# Patient Record
Sex: Female | Born: 1995 | Race: White | Hispanic: No | Marital: Single
Health system: Midwestern US, Academic
[De-identification: ages and names within clinical notes are randomized; demographics above are authoritative.]

---

## 2017-01-17 ENCOUNTER — Encounter: Admit: 2017-01-17 | Discharge: 2017-01-17 | Payer: BC Managed Care – PPO

## 2017-01-17 ENCOUNTER — Ambulatory Visit: Admit: 2017-01-17 | Discharge: 2017-01-18 | Payer: BC Managed Care – PPO

## 2017-01-17 NOTE — Progress Notes
Date of Service: 01/17/2017     Subjective:   DOI: Approximately 2016            History of Present Illness    Amanda Edwards is a 21 y.o. female here accompanied by her mother Amanda Edwards) at the recommendation of Amanda Edwards, DC for evaluation of chronic lumbosacral back pain which started without trauma at least 2 years ago.  She has an Buyer, retail at [employer] who is a Careers adviser for [employer]	[place] team and her lumbosacral back pain is interfering with her ability to play.  She recalls doing some weight training and with heavy squats and hanging clean she had pain in her lumbosacral back which would radiate to the posterior lateral right thigh and occasionally to her right calf.  She denies any foot pain paresthesias or dysesthesias.  Denies bowel or bladder dysfunction.  She did not work with her Producer, television/film/video as she states she frequently would cover up the pain in order to play.  Then last fall with some preseason conditioning she had increasing pain and would notice pain when she would bend forward to field the ball.  She has not had trouble batting is a right-handed batter.  She occasionally will feel pain as she throws from center field and follow through leaning forward.  Prolonged sitting does increase back pain.  She has no trouble sleeping.  She is not taking any medication for pain but did have some chiropractic manipulation which did not afford much relief.  Also describes some light therapy with her chiropractor which she thinks may be giving some relief.  She saw her primary care physician, Dr. Jiles Edwards, in [place] and had an MRI on 12/31/16.  She has not had plain radiographs.  The MRI did show a large central disc protrusion at L4-L5 and a smaller central disc protrusion at L5-S1 with comments from radiologist interpreting that she has a reduction in the AP diameter of the midline thecal sac.  She has tried meloxicam but no relief. PMH negative for previous back trauma or problems.  She has no chronic medical disorders  Medications: Currently none as she discontinued the meloxicam  Allergies: NKDA  Social history she is a Holiday representative at [employer].  She comes from an intact family and is working part-time at [employer] as a Conservation officer, nature.  Incidentally she states she is not having trouble working nor did she have any work-related injuries to her back.  Family history noncontributory for presenting complaint  13 point review of systems otherwise noncontributory except for that noted in the HPI above.  She gets the Depo-Provera injection so menstruates every 3 months         Review of Systems      Objective:         ??? NO HOME MEDICATIONS      Vitals:    01/17/17 1515   BP: 119/65   Pulse: 71   Resp: 16   SpO2: 99%   Weight: 63.5 kg (140 lb)   Height: 165.1 cm (65)     Body mass index is 23.3 kg/m???.     Physical Exam   Reviewed nursing intake, vital signs stable  General: Athletic appearing young female, NAD, articulate speech  Psych: affect and mood appropriate, cooperative during encounter  Neck: supple without lymphadenopathy  Chest/lungs: non-labored respirations  Skin:  non-diaphoretic    Ortho Exam  General-no acute distress ambulates without antalgic gait.  She has  good posture with no pelvic tilt and normal lumbar lordosis and thoracic kyphosis.  Back-full range of motion with increased lumbosacral pain on sustained back flexion but she can touch the dorsum of her feet.  She has no increased pain with back extension including single leg back extension does have some mild pain as she right laterally side bends but no pain on back rotation.  No tenderness or muscle spasm on palpation and no pain with percussion of her lumbar spine and paraspinal musculature to the SI joints bilaterally.  Straight leg raise seated is negative but supine does increase pain lumbosacral back and right buttocks and posterior thigh at 45???.  No lower extremity strength deficits on knee extension, flexion, ankle DF/PF/eversion, great toe extension/flexion.  DTRs 2+/4 patellar and Achilles bilaterally symmetrical.  Screening hip exam unremarkable.  X-rays-I chose to defer plain radiographs given review of the recent MRI dated 12/31/16 which shows L4-5 has a moderate-sized broad-based central disc protrusion reducing the midline AP diameter of the thecal sac to 0.8 cm.  L5-S1 has a small central disc protrusion without compression of the thecal sac I was unable to review images but these were requested from diagnostic imaging, Amanda Edwards location       Assessment and Plan:  1. Protrusion of lumbar intervertebral disc L4-L5  AMB REFERRAL TO PHYSICAL OR OCCUPATIONAL THERAPY    AMB REFERRAL TO SPINE CENTER   2. Right lumbosacral radiculopathy  AMB REFERRAL TO PHYSICAL OR OCCUPATIONAL THERAPY    AMB REFERRAL TO SPINE CENTER     Explained to patient and her mother that with chronicity of pain and clinical findings consistent with MRI findings in the absence of strength deficits and reflex deficits she would be a good candidate for epidural corticosteroid injections followed by specific spine physical therapy and can be coordinated also with her athletic training staff for her off-season conditioning.  However she will need to modify her activity significantly during this period of treatment and physical therapy can slowly get her back through a functional progression.  I would like to see her back one month after she initiates this treatment plan and patient and her mother expressed full understanding.  She will sign consent to allow [place] to communicate with her athletic training staff at [facility].    Your diagnosis today is:  1) L4-L5 central disc protrusion with smaller disc protrusion at L5-S1; 2) Right LS radiculopathy    Recommendations as follows:    1.  Activity modification:  No running, jumping, fielding, or hitting until epidural injection done and PT starts but can cross train with swimming, elliptical, and back neutral weight training (as directed by PT)  2.  Medication:  None but ok for Tylenol for pain  3.  Therapy:  PT Pernell Dupre PT or [facility]  4.  Intervention:  Referral to pain specialist for epidural cortisone injection  5.  Follow up:  One month after starting above treatment plan.  We will communicate this to your ATC at [facility]        Please call my Clinical Athletic Trainer Aundra Millet) at [phone] if clinical questions / concerns.      For follow-up appointments call [phone].    The above note was completed using a dragon voice recognition system so please excuse any typos/misspellings/errors.  If you need clarification please contact my office.

## 2017-01-18 ENCOUNTER — Encounter: Admit: 2017-01-18 | Discharge: 2017-01-18 | Payer: BC Managed Care – PPO

## 2017-01-18 DIAGNOSIS — R69 Illness, unspecified: Principal | ICD-10-CM

## 2017-01-18 DIAGNOSIS — M5417 Radiculopathy, lumbosacral region: ICD-10-CM

## 2017-01-18 DIAGNOSIS — M5126 Other intervertebral disc displacement, lumbar region: Principal | ICD-10-CM

## 2017-02-12 ENCOUNTER — Encounter: Admit: 2017-02-12 | Discharge: 2017-02-12 | Payer: BC Managed Care – PPO

## 2017-02-12 ENCOUNTER — Ambulatory Visit: Admit: 2017-02-12 | Discharge: 2017-02-13 | Payer: BC Managed Care – PPO

## 2017-02-12 DIAGNOSIS — M47819 Spondylosis without myelopathy or radiculopathy, site unspecified: ICD-10-CM

## 2017-02-12 DIAGNOSIS — M5126 Other intervertebral disc displacement, lumbar region: Secondary | ICD-10-CM

## 2017-02-12 MED ORDER — TIZANIDINE 2 MG PO TAB
4 mg | ORAL_TABLET | Freq: Every evening | ORAL | 3 refills | Status: AC | PRN
Start: 2017-02-12 — End: ?

## 2017-02-12 NOTE — Progress Notes
Dear Dr Jiles Prows,    I appreciate your kind referral of Amanda Edwards for evaluation of pain. Please see my note below for the full details of the evaluation and management plan.    Thank you,    Evelina Bucy, MD        [facility]  NEW PATIENT HISTORY AND PHYSICAL  Subjective     Chief Complaint: LBP    HPI: Amanda Edwards is a 21 y.o. female who  has no past medical history on file. who now presents for initial consultation.    The pain is in the low back in a band across.  There is radiating pain down the right leg in the anterior and posterolateral distribution.   She also notes pain radiating into the bilateral groin.     Pain started: 2016    Initial inciting injury or event: Possibly due to weight lifting in high school    Numbness/tingling: Yes    The pain ranges 3-10/10    The pain is described as aching, stabbing, shooting, burning, throbbing, sharp.     The pain is exacerbated by sitting and standing.     The pain is partially alleviated by heat, TENS unit.     +muscle stiffness/tightness  Extension of the back is painful    Patient denies achiness/heaviness of legs with walking a long distance that improves with rest. Negative Shopping Cart sign.          PRIOR MEDICATIONS:   Effective  Ibuprofen    Ineffective  Meloxicam  Acetaminophen    Unable to tolerate      Never  Gabapentin  Lyrica  Ami/Nortriptyline  Cymbalta  Tizanidine    PRIOR INTERVENTIONS:  No spine surgery or injections  Effective  TENS unit (little)    Ineffective  Chiropractor  Physician-ordered PT  Exercise, limited by pain      Amanda Edwards denies any recent fevers, chills, infection, antibiotics, bowel or bladder incontinence, saddle anesthesia, bleeding issues, or recent anticoagulant.     ROS: All 14 systems reviewed and found to be negative except as above and as follows.     Past Medical History:  No past medical history on file.    Family History:  No family history on file. Social History:  Lives in [place], [place] (40 min away)  Social History     Social History   ??? Marital status: Single     Spouse name: N/A   ??? Number of children: N/A   ??? Years of education: N/A     Occupational History   ??? Not on file.     Social History Main Topics   ??? Smoking status: Never Smoker   ??? Smokeless tobacco: Never Used   ??? Alcohol use Yes   ??? Drug use: No   ??? Sexual activity: Not on file     Other Topics Concern   ??? Not on file     Social History Narrative   ??? No narrative on file       Allergies:  Allergies   Allergen Reactions   ??? Amoxicillin VOMITING       Medications:    Current Outpatient Prescriptions:   ???  NO HOME MEDICATIONS, , Disp: , Rfl:     Physical examination:   BP 125/72 (BP Source: Arm, Left Upper, Patient Position: Sitting)  - Pulse 89  - Resp 16  - Ht 165.1 cm (65)  -  Wt 63.5 kg (140 lb)  - SpO2 98%  - BMI 23.30 kg/m???   Pain Score: Two    General: The patient is a well-developed, well nourished 21 y.o. female in no acute distress.   HEENT: Head is normocephalic and atraumatic. Pupils are equal and reactive to light bilaterally.   Cardiac: Based on palpation, pulse appears to be regular rate and rhythm.   Pulmonary: The patient has unlabored respirations and bilateral symmetric chest excursion.   Abdomen: Soft, nontender, and nondistended. There is no rebound or guarding.   Extremities: No clubbing, cyanosis, or edema.     Neurologic:   The patient is alert and oriented times 3.   Cranial nerves II through XII are intact without any focal deficits.   There is no sensory deficit to light touch or pinprick in the affected areas. There is no allodynia noted.    Musculoskeletal:   Gait is normal.    L-Spine   There is no point vertebral tenderness in the midline.    There is mod bilateral low lumbar paraspinal tenderness. Paraspinal muscle tone is increased.  Facet loading is positive.  There is no tenderness or radiating pain with palpation over the SI joints, piriformis, or greater trochanteric bursae bilaterally.  FABER is negative.  ROM with flexion, extension, rotation, and lateral bending is intact.  Strength is equal and adequate bilaterally in the flexors and extensors of the bilateral lower extremities.   Reflexes are 2-/4 at the patella and achilles bilaterally.   SLR is positive in the RLE.      MRI L-Spine Results:  12/2016  [facility]  Large central disc protrusion at L4-L5 and a smaller central disc protrusion at L5-S1 with comments from radiologist interpreting that she has a reduction in the AP diameter of the midline thecal sac      Last Cr and LFT's:  No results found for: CR, AST, ALT, ALKPHOS, TOTBILI       Assessment:    Amanda Edwards is a 21 y.o. female who  has no past medical history on file. who presents for evaluation of LBP.     The pain complaints are most likely due to:    1. Lumbosacral radiculopathy  [place] AMB SPINE INJECT SNRB/TFESI LUMBAR/SACRAL   2. DDD (degenerative disc disease), lumbosacral  [place] AMB SPINE INJECT SNRB/TFESI LUMBAR/SACRAL   3. Facet arthropathy (HCC)  [place] AMB SPINE INJECT SNRB/TFESI LUMBAR/SACRAL   4. Myalgia  [place] AMB SPINE INJECT SNRB/TFESI LUMBAR/SACRAL   5. Spasm of muscle  [place] AMB SPINE INJECT SNRB/TFESI LUMBAR/SACRAL       Patient has had an adequate trial of > 3 months of rest, exercise, NSAID's, multimodal treatment, and the passage of time without improvement of symptoms. The pain has significant impact on the daily quality of life.     Plan:    1. Plan for Right L4-5 and L5-S1 TFESI at first available appointment.   2. Will trial tizanidine 2-4mg  qhs.   3. Consider TPI if myofascial component persists.   4. Follow up as needed.     Risks/benefits of all pharmacologic and interventional treatments discussed and questions answered.     Thank you for this kind referral for consultation. Please feel free to contact me with any questions or concerns.

## 2017-02-13 DIAGNOSIS — M62838 Other muscle spasm: ICD-10-CM

## 2017-02-13 DIAGNOSIS — M4690 Unspecified inflammatory spondylopathy, site unspecified: ICD-10-CM

## 2017-02-13 DIAGNOSIS — M791 Myalgia: ICD-10-CM

## 2017-02-13 DIAGNOSIS — M5417 Radiculopathy, lumbosacral region: Principal | ICD-10-CM

## 2017-02-13 DIAGNOSIS — M5137 Other intervertebral disc degeneration, lumbosacral region: ICD-10-CM

## 2017-02-22 ENCOUNTER — Encounter: Admit: 2017-02-22 | Discharge: 2017-02-22 | Payer: BC Managed Care – PPO

## 2017-02-22 ENCOUNTER — Ambulatory Visit: Admit: 2017-02-22 | Discharge: 2017-02-22 | Payer: BC Managed Care – PPO

## 2017-02-22 ENCOUNTER — Ambulatory Visit: Admit: 2017-02-22 | Discharge: 2017-02-23 | Payer: BC Managed Care – PPO

## 2017-02-22 DIAGNOSIS — M5416 Radiculopathy, lumbar region: Principal | ICD-10-CM

## 2017-04-06 ENCOUNTER — Encounter: Admit: 2017-04-06 | Discharge: 2017-04-06 | Payer: BC Managed Care – PPO

## 2017-04-06 DIAGNOSIS — M5416 Radiculopathy, lumbar region: Principal | ICD-10-CM

## 2017-04-06 NOTE — Telephone Encounter
Last had Right L4-5 and L5-S1 TFESI on 7/10.   Asks to repeat injection to help improve results. LM for patient.

## 2017-04-26 ENCOUNTER — Ambulatory Visit: Admit: 2017-04-26 | Discharge: 2017-04-26 | Payer: BC Managed Care – PPO

## 2017-04-26 ENCOUNTER — Ambulatory Visit: Admit: 2017-04-26 | Discharge: 2017-04-27 | Payer: BC Managed Care – PPO

## 2017-04-26 ENCOUNTER — Encounter: Admit: 2017-04-26 | Discharge: 2017-04-26 | Payer: BC Managed Care – PPO

## 2017-04-26 DIAGNOSIS — M5416 Radiculopathy, lumbar region: Principal | ICD-10-CM

## 2018-11-21 IMAGING — CR LOW_EXM
2 series · 2 of 2 positions shown · non-contrast
Comparison: none

[tib-fib ap]
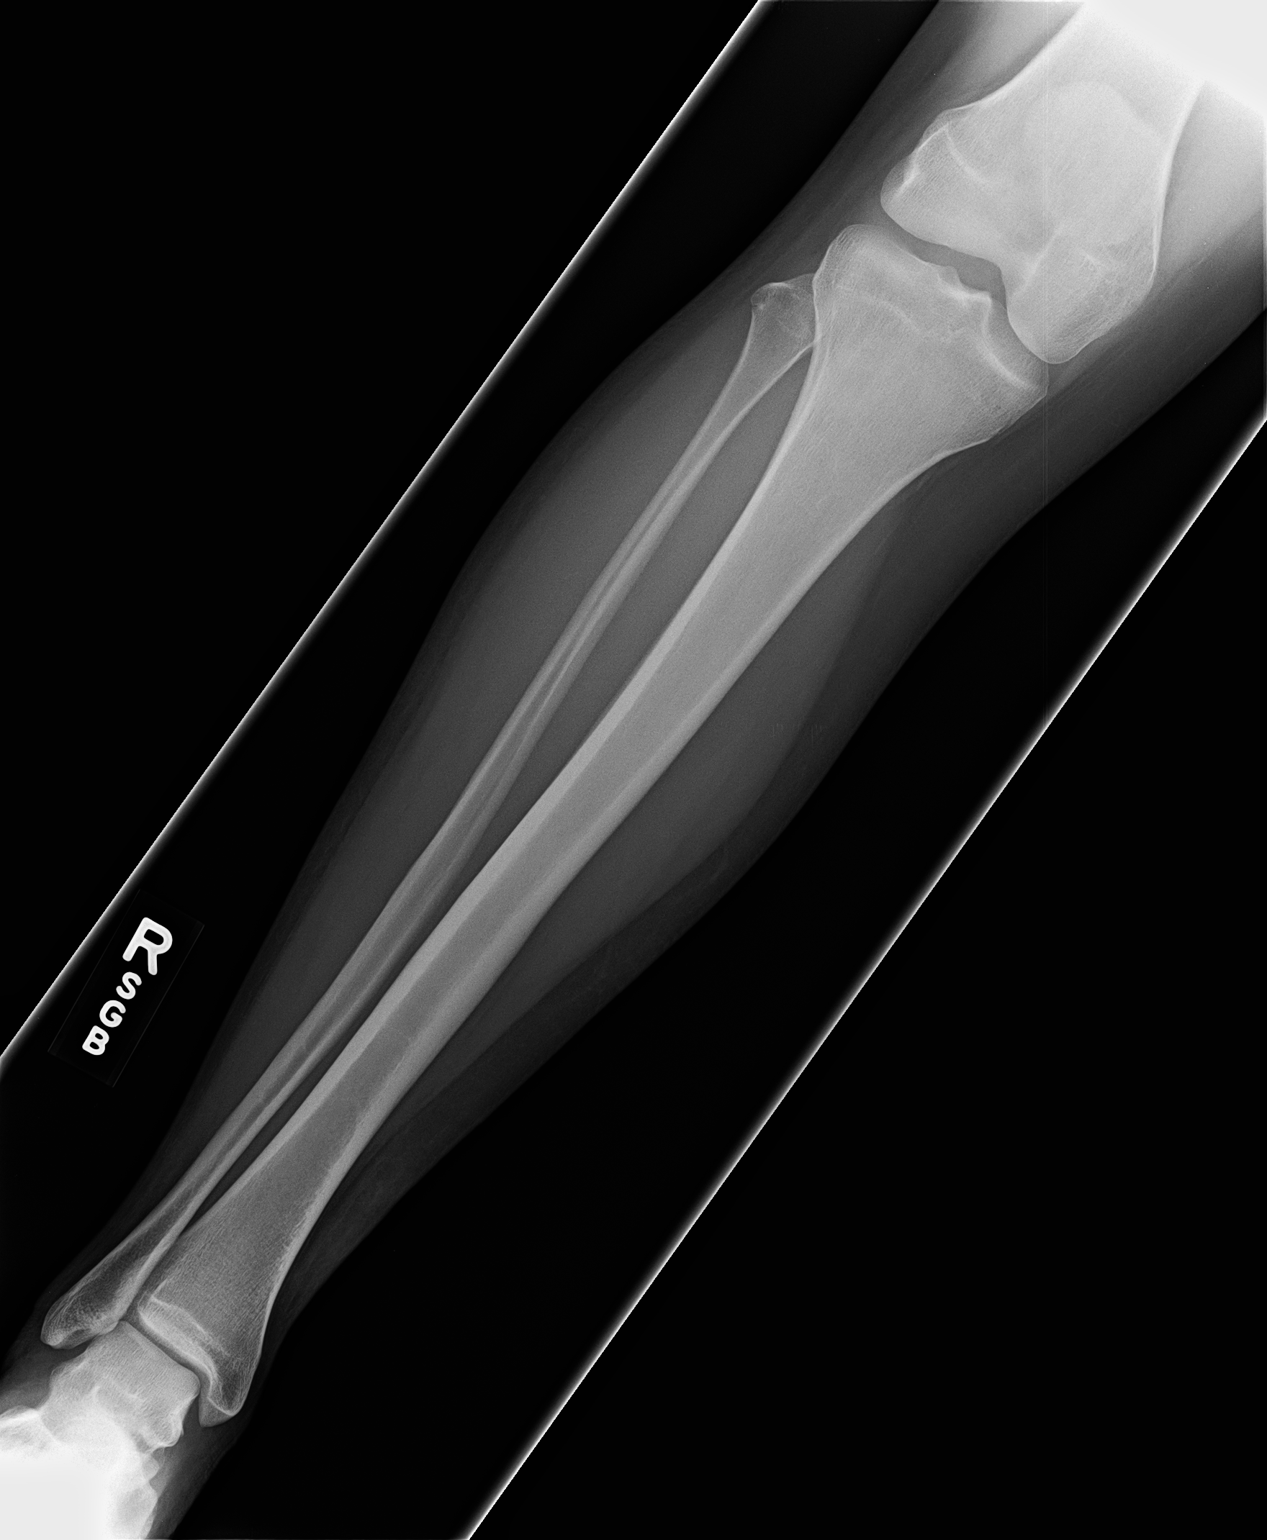

[tib-fib lat]
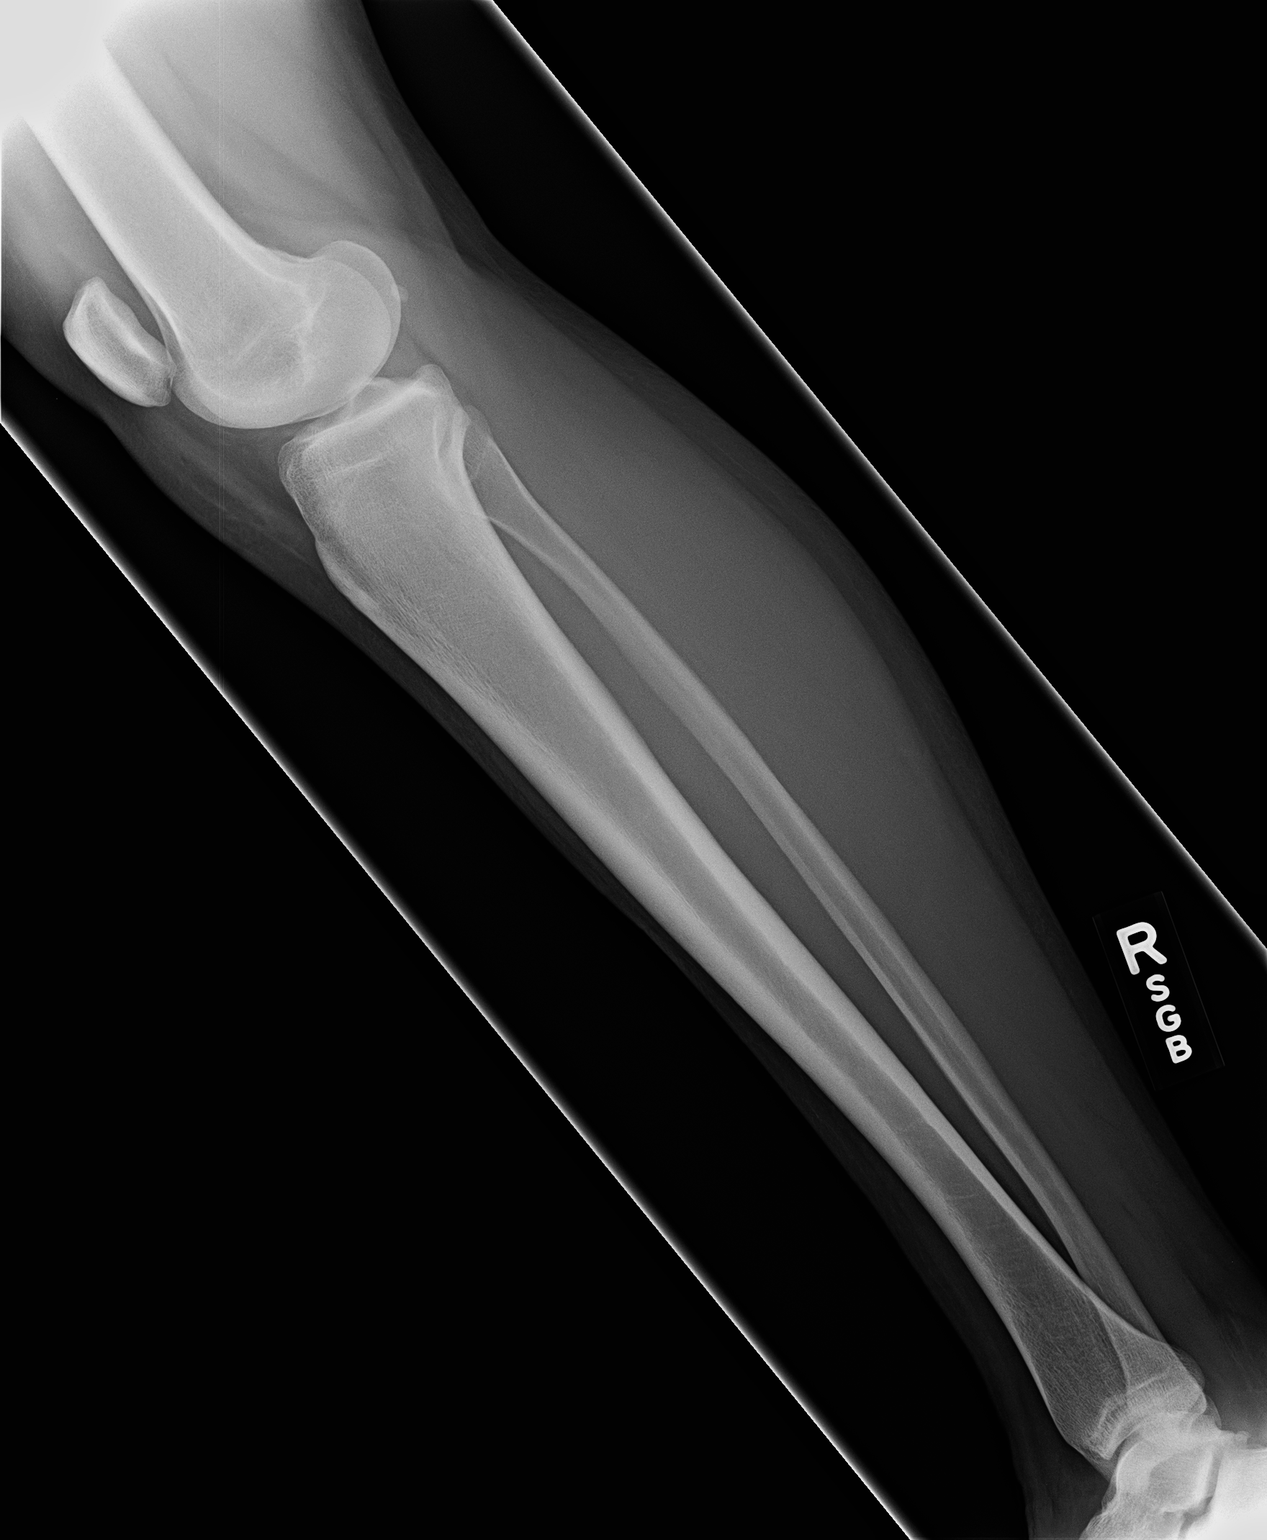

[2 of 2 positions shown; findings below may reference images not displayed]

DIAGNOSTIC STUDIES

EXAM
Two-view right tibia and fibula

INDICATION
Right lower leg pain

TECHNIQUE
Frontal and lateral views

COMPARISONS
None

FINDINGS
No acute displaced fracture or malalignment. No knee or ankle joint space loss. No destructive
bone lesion. No focal tibial cortical lucency or periosteal reaction.

IMPRESSION
No acute bony abnormality.

## 2019-05-02 ENCOUNTER — Encounter: Admit: 2019-05-02 | Discharge: 2019-05-02 | Payer: 59

## 2019-05-02 NOTE — Telephone Encounter
05/02/2019 0830 Evelena Peat, LPN from [facility] from [place] called and LVM on RN line stating that she had sent referral on patient to see TAB and was calling to see how quickly patient could be seen by TAB. Evelena Peat requested call back to [phone]. RN returned call to [place] at provided call back number. RN was able to speak with Evelena Peat and informed her that patient was scheduled to see TAB on 9/25 at 0800 in the [facility] office ([address]). Evelena Peat v/u and had no further questions. Will remain available. Tammi Klippel, RN.

## 2019-05-03 ENCOUNTER — Encounter: Admit: 2019-05-03 | Discharge: 2019-05-03 | Payer: 59

## 2019-05-03 NOTE — Telephone Encounter
On 05/03/19 received records from Dr Aleda Grana. They can be accessed through [employer] for the 05/11/19 appt.

## 2019-05-11 ENCOUNTER — Encounter: Admit: 2019-05-11 | Discharge: 2019-05-11 | Payer: 59

## 2023-04-22 ENCOUNTER — Encounter: Admit: 2023-04-22 | Discharge: 2023-04-22 | Payer: 59
